# Patient Record
Sex: Female | Born: 1970 | Race: Black or African American | Hispanic: No | Smoking: Never smoker
Health system: Southern US, Community
[De-identification: ages and names within clinical notes are randomized; demographics above are authoritative.]

## PROBLEM LIST (undated history)

## (undated) DIAGNOSIS — N939 Abnormal uterine and vaginal bleeding, unspecified: Secondary | ICD-10-CM

## (undated) DIAGNOSIS — N649 Disorder of breast, unspecified: Secondary | ICD-10-CM

## (undated) DIAGNOSIS — D259 Leiomyoma of uterus, unspecified: Secondary | ICD-10-CM

## (undated) DIAGNOSIS — I10 Essential (primary) hypertension: Secondary | ICD-10-CM

## (undated) DIAGNOSIS — N9489 Other specified conditions associated with female genital organs and menstrual cycle: Secondary | ICD-10-CM

## (undated) DIAGNOSIS — Z8759 Personal history of other complications of pregnancy, childbirth and the puerperium: Secondary | ICD-10-CM

## (undated) DIAGNOSIS — N920 Excessive and frequent menstruation with regular cycle: Secondary | ICD-10-CM

## (undated) DIAGNOSIS — R112 Nausea with vomiting, unspecified: Secondary | ICD-10-CM

## (undated) DIAGNOSIS — D649 Anemia, unspecified: Secondary | ICD-10-CM

## (undated) DIAGNOSIS — B009 Herpesviral infection, unspecified: Secondary | ICD-10-CM

## (undated) DIAGNOSIS — Z9889 Other specified postprocedural states: Secondary | ICD-10-CM

## (undated) HISTORY — DX: Disorder of breast, unspecified: N64.9

## (undated) HISTORY — PX: LASIK: SHX215

## (undated) HISTORY — DX: Essential (primary) hypertension: I10

## (undated) HISTORY — DX: Other specified conditions associated with female genital organs and menstrual cycle: N94.89

## (undated) HISTORY — DX: Herpesviral infection, unspecified: B00.9

## (undated) HISTORY — PX: BREAST SURGERY: SHX581

---

## 1997-12-02 DIAGNOSIS — R112 Nausea with vomiting, unspecified: Secondary | ICD-10-CM

## 1997-12-02 DIAGNOSIS — N649 Disorder of breast, unspecified: Secondary | ICD-10-CM

## 1997-12-02 HISTORY — DX: Nausea with vomiting, unspecified: R11.2

## 1997-12-02 HISTORY — DX: Disorder of breast, unspecified: N64.9

## 1997-12-02 HISTORY — PX: BACK SURGERY: SHX140

## 1998-07-08 ENCOUNTER — Emergency Department (HOSPITAL_COMMUNITY): Admission: EM | Admit: 1998-07-08 | Discharge: 1998-07-08 | Payer: Self-pay | Admitting: Emergency Medicine

## 1998-09-26 ENCOUNTER — Ambulatory Visit (HOSPITAL_COMMUNITY): Admission: RE | Admit: 1998-09-26 | Discharge: 1998-09-27 | Payer: Self-pay | Admitting: Orthopaedic Surgery

## 1998-11-11 ENCOUNTER — Encounter: Payer: Self-pay | Admitting: *Deleted

## 1998-11-11 ENCOUNTER — Inpatient Hospital Stay (HOSPITAL_COMMUNITY): Admission: AD | Admit: 1998-11-11 | Discharge: 1998-11-11 | Payer: Self-pay | Admitting: *Deleted

## 1998-11-12 ENCOUNTER — Inpatient Hospital Stay (HOSPITAL_COMMUNITY): Admission: AD | Admit: 1998-11-12 | Discharge: 1998-11-12 | Payer: Self-pay | Admitting: *Deleted

## 1998-11-15 ENCOUNTER — Ambulatory Visit (HOSPITAL_COMMUNITY): Admission: RE | Admit: 1998-11-15 | Discharge: 1998-11-15 | Payer: Self-pay | Admitting: *Deleted

## 1998-11-19 ENCOUNTER — Inpatient Hospital Stay (HOSPITAL_COMMUNITY): Admission: AD | Admit: 1998-11-19 | Discharge: 1998-11-19 | Payer: Self-pay | Admitting: Obstetrics & Gynecology

## 1998-11-22 ENCOUNTER — Ambulatory Visit (HOSPITAL_COMMUNITY): Admission: RE | Admit: 1998-11-22 | Discharge: 1998-11-22 | Payer: Self-pay | Admitting: Obstetrics and Gynecology

## 1998-11-25 ENCOUNTER — Inpatient Hospital Stay (HOSPITAL_COMMUNITY): Admission: AD | Admit: 1998-11-25 | Discharge: 1998-11-25 | Payer: Self-pay | Admitting: Obstetrics and Gynecology

## 1998-11-28 ENCOUNTER — Inpatient Hospital Stay (HOSPITAL_COMMUNITY): Admission: AD | Admit: 1998-11-28 | Discharge: 1998-11-28 | Payer: Self-pay | Admitting: *Deleted

## 1998-12-15 ENCOUNTER — Encounter: Payer: Self-pay | Admitting: Emergency Medicine

## 1998-12-15 ENCOUNTER — Emergency Department (HOSPITAL_COMMUNITY): Admission: EM | Admit: 1998-12-15 | Discharge: 1998-12-15 | Payer: Self-pay | Admitting: Emergency Medicine

## 2000-12-11 ENCOUNTER — Encounter: Payer: Self-pay | Admitting: Emergency Medicine

## 2000-12-11 ENCOUNTER — Emergency Department (HOSPITAL_COMMUNITY): Admission: EM | Admit: 2000-12-11 | Discharge: 2000-12-11 | Payer: Self-pay | Admitting: Emergency Medicine

## 2002-01-29 ENCOUNTER — Other Ambulatory Visit: Admission: RE | Admit: 2002-01-29 | Discharge: 2002-01-29 | Payer: Self-pay | Admitting: Family Medicine

## 2002-06-17 ENCOUNTER — Emergency Department (HOSPITAL_COMMUNITY): Admission: EM | Admit: 2002-06-17 | Discharge: 2002-06-17 | Payer: Self-pay | Admitting: Emergency Medicine

## 2003-01-31 ENCOUNTER — Other Ambulatory Visit: Admission: RE | Admit: 2003-01-31 | Discharge: 2003-01-31 | Payer: Self-pay | Admitting: Family Medicine

## 2004-09-13 ENCOUNTER — Other Ambulatory Visit: Admission: RE | Admit: 2004-09-13 | Discharge: 2004-09-13 | Payer: Self-pay | Admitting: Obstetrics and Gynecology

## 2006-02-19 ENCOUNTER — Other Ambulatory Visit: Admission: RE | Admit: 2006-02-19 | Discharge: 2006-02-19 | Payer: Self-pay | Admitting: Obstetrics and Gynecology

## 2007-03-10 ENCOUNTER — Ambulatory Visit (HOSPITAL_COMMUNITY): Admission: RE | Admit: 2007-03-10 | Discharge: 2007-03-10 | Payer: Self-pay | Admitting: Obstetrics and Gynecology

## 2009-03-24 ENCOUNTER — Encounter: Admission: RE | Admit: 2009-03-24 | Discharge: 2009-03-24 | Payer: Self-pay | Admitting: Obstetrics and Gynecology

## 2010-03-20 ENCOUNTER — Encounter: Admission: RE | Admit: 2010-03-20 | Discharge: 2010-03-20 | Payer: Self-pay | Admitting: Obstetrics and Gynecology

## 2011-02-26 ENCOUNTER — Other Ambulatory Visit: Payer: Self-pay | Admitting: Obstetrics and Gynecology

## 2011-02-26 DIAGNOSIS — Z1231 Encounter for screening mammogram for malignant neoplasm of breast: Secondary | ICD-10-CM

## 2011-04-16 ENCOUNTER — Ambulatory Visit
Admission: RE | Admit: 2011-04-16 | Discharge: 2011-04-16 | Disposition: A | Discharge status: Home / Self Care | Payer: Federal, State, Local not specified - PPO | Source: Ambulatory Visit | Attending: Obstetrics and Gynecology | Admitting: Obstetrics and Gynecology

## 2011-04-16 DIAGNOSIS — Z1231 Encounter for screening mammogram for malignant neoplasm of breast: Secondary | ICD-10-CM

## 2012-04-20 ENCOUNTER — Other Ambulatory Visit: Payer: Self-pay | Admitting: Obstetrics and Gynecology

## 2012-04-20 DIAGNOSIS — Z1231 Encounter for screening mammogram for malignant neoplasm of breast: Secondary | ICD-10-CM

## 2012-04-29 ENCOUNTER — Ambulatory Visit
Admission: RE | Admit: 2012-04-29 | Discharge: 2012-04-29 | Disposition: A | Discharge status: Home / Self Care | Payer: Federal, State, Local not specified - PPO | Source: Ambulatory Visit | Attending: Obstetrics and Gynecology | Admitting: Obstetrics and Gynecology

## 2012-04-29 ENCOUNTER — Ambulatory Visit (INDEPENDENT_AMBULATORY_CARE_PROVIDER_SITE_OTHER): Payer: Federal, State, Local not specified - PPO | Admitting: Obstetrics and Gynecology

## 2012-04-29 ENCOUNTER — Encounter: Payer: Self-pay | Admitting: Obstetrics and Gynecology

## 2012-04-29 VITALS — BP 112/64 | Ht 67.0 in | Wt 164.0 lb

## 2012-04-29 DIAGNOSIS — N920 Excessive and frequent menstruation with regular cycle: Secondary | ICD-10-CM

## 2012-04-29 DIAGNOSIS — Z1231 Encounter for screening mammogram for malignant neoplasm of breast: Secondary | ICD-10-CM

## 2012-04-29 DIAGNOSIS — Z01419 Encounter for gynecological examination (general) (routine) without abnormal findings: Secondary | ICD-10-CM

## 2012-04-29 MED ORDER — ETONOGESTREL-ETHINYL ESTRADIOL 0.12-0.015 MG/24HR VA RING: VAGINAL_RING | VAGINAL | Status: DC

## 2012-04-29 NOTE — Progress Notes (Signed)
The patient reports:no complaints  Contraception:NuvaRing vaginal inserts  Last mammogram: had mammo today  Last pap: approximate date 03/26/2010 and was normal  GC/Chlamydia cultures offered: declined HIV/RPR/HbsAg offered:  declined HSV 1 and 2 glycoprotein offered: declined  Menstrual cycle regular and monthly: Yes Menstrual flow normal: Yes  Urinary symptoms: none Normal bowel movements: Yes Reports abuse at home: No:   Pt states that she injured her back in 08/2011 and is on muscle relaxer but is not sure of the name . Pt wanting to do a pap this visit.   Subjective:    Katie Kemp is a 41 y.o. female, [id number], who presents for an annual exam.     History   Social History  . Marital Status: Legally Separated    Spouse Name: N/A    Number of Children: N/A  . Years of Education: N/A   Social History Main Topics  . Smoking status: Never Smoker   . Smokeless tobacco: Never Used  . Alcohol Use: No  . Drug Use: No  . Sexually Active: Yes    Birth Control/ Protection: Inserts   Other Topics Concern  . None   Social History Narrative  . None    Menstrual cycle:   LMP: Patient's last menstrual period was 04/01/2012.           Cycle: Every 3 months as withdrawal from extended cycle Nuvaring  The following portions of the patient's history were reviewed and updated as appropriate: allergies, current medications, past family history, past medical history, past social history, past surgical history and problem list.  Review of Systems Pertinent items are noted in HPI. Breast:Negative for breast lump,nipple discharge or nipple retraction Gastrointestinal: Negative for abdominal pain, change in bowel habits or rectal bleeding Urinary:negative   Objective:    BP 112/64  Ht 5\' 7"  (1.702 m)  Wt 164 lb (74.39 kg)  BMI 25.69 kg/m2  LMP 04/01/2012    Weight:  Wt Readings from Last 1 Encounters:  04/29/12 164 lb (74.39 kg)          BMI: Body mass index is 25.69  kg/(m^2).  General Appearance: Alert, appropriate appearance for age. No acute distress HEENT: Grossly normal Neck / Thyroid: Supple, no masses, nodes or enlargement Lungs: clear to auscultation bilaterally Back: No CVA tenderness Breast Exam: No masses or nodes.No dimpling, nipple retraction or discharge. Cardiovascular: Regular rate and rhythm. S1, S2, no murmur Gastrointestinal: Soft, non-tender, no masses or organomegaly Pelvic Exam: External genitalia: normal general appearance Vaginal: normal mucosa without prolapse or lesions Cervix: normal appearance Adnexa: not enlarged Uterus: 8 wks size mobile and nontender Rectovaginal: normal rectal, no masses Lymphatic Exam: Non-palpable nodes in neck, clavicular, axillary, or inguinal regions Skin: no rash or abnormalities Neurologic: Normal gait and speech, no tremor  Psychiatric: Alert and oriented, appropriate affect.      Assessment:    Asymptomatic fibroids  Menorrhagia controlled with extended cycle Nvaring   Plan:    mammogram pap smear due 2014 with HR HPV return annually or prn STD screening: declined Contraception:NuvaRing vaginal inserts      Amogh Komatsu PMD

## 2012-05-04 LAB — PAP IG AND HPV HIGH-RISK

## 2012-05-06 ENCOUNTER — Other Ambulatory Visit: Payer: Self-pay | Admitting: Obstetrics and Gynecology

## 2012-05-06 DIAGNOSIS — R928 Other abnormal and inconclusive findings on diagnostic imaging of breast: Secondary | ICD-10-CM

## 2012-05-13 ENCOUNTER — Ambulatory Visit
Admission: RE | Admit: 2012-05-13 | Discharge: 2012-05-13 | Disposition: A | Discharge status: Home / Self Care | Payer: Federal, State, Local not specified - PPO | Source: Ambulatory Visit | Attending: Obstetrics and Gynecology | Admitting: Obstetrics and Gynecology

## 2012-05-13 DIAGNOSIS — R928 Other abnormal and inconclusive findings on diagnostic imaging of breast: Secondary | ICD-10-CM

## 2012-06-22 ENCOUNTER — Telehealth: Payer: Self-pay

## 2012-06-22 ENCOUNTER — Telehealth: Payer: Self-pay | Admitting: Obstetrics and Gynecology

## 2012-06-22 NOTE — Telephone Encounter (Signed)
Chandra/vph/rx

## 2012-06-22 NOTE — Telephone Encounter (Signed)
vph pt 

## 2012-06-22 NOTE — Telephone Encounter (Signed)
Tc from pt rgdg Nuvaring rx. Pt wants Nuvaring in a 90d supply. Pc to [facility]([address]) and rx for Nuvaring changed to 90d supply. Spoke with Mia(pharm). Pt aware.

## 2012-06-24 ENCOUNTER — Encounter: Payer: Self-pay | Admitting: Obstetrics and Gynecology

## 2012-09-08 ENCOUNTER — Telehealth: Payer: Self-pay

## 2012-09-08 NOTE — Telephone Encounter (Signed)
VM from pt. Pt states pharmacy is giving her a hard time with her Nuva Rings. She stated they are giving her one Nuva Ring at a time instead of three. She requests a call to the pharmacy to fix this issue & if they can't fix it, she wants rx to be sent to a different pharmacy.

## 2012-09-08 NOTE — Telephone Encounter (Signed)
Pc to pharm on file. Rx for Nuvaring corrected to disp#3 per Nuvaring directions. Spoke with Mia(pharm). Pt aware.

## 2013-06-02 ENCOUNTER — Other Ambulatory Visit: Payer: Self-pay

## 2013-06-02 DIAGNOSIS — Z1231 Encounter for screening mammogram for malignant neoplasm of breast: Secondary | ICD-10-CM

## 2013-06-07 ENCOUNTER — Ambulatory Visit
Admission: RE | Admit: 2013-06-07 | Discharge: 2013-06-07 | Disposition: A | Discharge status: Home / Self Care | Payer: Federal, State, Local not specified - PPO | Source: Ambulatory Visit

## 2013-06-07 DIAGNOSIS — Z1231 Encounter for screening mammogram for malignant neoplasm of breast: Secondary | ICD-10-CM

## 2014-04-12 ENCOUNTER — Other Ambulatory Visit (HOSPITAL_COMMUNITY): Payer: Self-pay | Admitting: Cardiology

## 2014-04-12 DIAGNOSIS — R079 Chest pain, unspecified: Secondary | ICD-10-CM

## 2014-04-25 ENCOUNTER — Other Ambulatory Visit (HOSPITAL_COMMUNITY): Payer: Federal, State, Local not specified - PPO

## 2014-04-26 ENCOUNTER — Other Ambulatory Visit (HOSPITAL_COMMUNITY): Payer: Federal, State, Local not specified - PPO

## 2014-05-20 ENCOUNTER — Institutional Professional Consult (permissible substitution): Payer: Federal, State, Local not specified - PPO | Admitting: Internal Medicine

## 2014-05-23 ENCOUNTER — Encounter: Payer: Self-pay | Admitting: Internal Medicine

## 2014-05-23 ENCOUNTER — Ambulatory Visit (INDEPENDENT_AMBULATORY_CARE_PROVIDER_SITE_OTHER): Payer: Federal, State, Local not specified - PPO | Admitting: Internal Medicine

## 2014-05-23 ENCOUNTER — Other Ambulatory Visit: Payer: Federal, State, Local not specified - PPO

## 2014-05-23 VITALS — BP 126/78 | HR 78 | Ht 67.0 in | Wt 174.8 lb

## 2014-05-23 DIAGNOSIS — J3089 Other allergic rhinitis: Secondary | ICD-10-CM

## 2014-05-23 DIAGNOSIS — J309 Allergic rhinitis, unspecified: Secondary | ICD-10-CM

## 2014-05-23 DIAGNOSIS — J302 Other seasonal allergic rhinitis: Secondary | ICD-10-CM

## 2014-05-23 NOTE — Patient Instructions (Signed)
Ok to try the Neti pot, once a day or so, if needed   Order- lab- Allergy profile       Dx allergic rhinitis  Consider trying otc nasal spray Nasalcrom/ cromol/ cromolyn

## 2014-05-23 NOTE — Progress Notes (Signed)
05/23/14- 43 yo F never smoker COMPLAINS OF:  Referred by Dr. Wallene Huh.  Reports sinus pressure, cough, PND.  States if she does not Allegra face swells and sinuses clog up, gets presure headaches. In last month sneezing, blowing clear.  Problem x 3 years. Comfortable if she stays on allegra. No prior hx seasonal rhinitis, asthma, haves or other allergy.  No ENT surgery. No obvious effect of exposure, location. Environment- house, carpet, no basement, no smokers, no pet. 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 No mold noted. Divorced, living alone.  Prior to Admission medications   Medication Sig Start Date End Date Taking? Authorizing Zaedyn Covin  AZOR 5-20 MG per tablet 1 tablet daily. 05/18/14  Yes Historical Exie Chrismer, MD  etonogestrel-ethinyl estradiol (NUVARING) 0.12-0.015 MG/24HR vaginal ring See instructions above 04/29/12  Yes Eldred Manges, MD  fexofenadine (ALLEGRA ALLERGY) 180 MG tablet Take 180 mg by mouth daily.   Yes Historical Esley Brooking, MD  HYDROcodone-acetaminophen (NORCO/VICODIN) 5-325 MG per tablet Take 1 tablet by mouth as needed for moderate pain.   Yes Historical Lenwood Balsam, MD  Multiple Vitamin (MULTIVITAMIN) tablet Take 1 tablet by mouth daily.   Yes Historical Janella Rogala, MD  naproxen (NAPROSYN) 500 MG tablet Take 500 mg by mouth as needed.   Yes Historical Meribeth Vitug, MD   Past Medical History  Diagnosis Date  . HSV-2 (herpes simplex virus 2) infection   . Threatened abortion   . H/O menorrhagia   . Breast lesion     history of transient breast lesion  . Ectopic pregnancy 2000  . Adnexal mass     right  . Stroke   . Hypertension   . Diabetes mellitus   . Fibroid    Past Surgical History  Procedure Laterality Date  . Back surgery     Family History  Problem Relation Age of Onset  . Stroke Father   . Hypertension Father   . Cancer Father     prostate  . Anemia Sister    History   Social History  . Marital Status: Divorced    Spouse Name: N/A    Number of Children:  N/A  . Years of Education: N/A   Occupational History  . Not on file.   Social History Main Topics  . Smoking status: Never Smoker   . Smokeless tobacco: Never Used  . Alcohol Use: No  . Drug Use: No  . Sexual Activity: Yes    Birth Control/ Protection: Inserts   Other Topics Concern  . Not on file   Social History Narrative  . No narrative on file   ROS-see HPI Constitutional:   No-   weight loss, night sweats, fevers, chills, fatigue, lassitude. HEENT:  + headaches, difficulty swallowing, tooth/dental problems, sore throat,       +sneezing, itching, ear ache, +nasal congestion, +post nasal drip,  CV:  No-   chest pain, orthopnea, PND, swelling in lower extremities, anasarca,                                  dizziness, palpitations Resp: No-   shortness of breath with exertion or at rest.              No-   productive cough,  No non-productive cough,  No- coughing up of blood.              No-   change in color of mucus.  No- wheezing.  Skin: No-   rash or lesions. GI:  No-   heartburn, indigestion, abdominal pain, nausea, vomiting, diarrhea,                 change in bowel habits, loss of appetite GU: No-   dysuria, change in color of urine, no urgency or frequency.  No- flank pain. MS:  No-   joint pain or swelling.  No- decreased range of motion.  No- back pain. Neuro-     nothing unusual Psych:  No- change in mood or affect. No depression or anxiety.  No memory loss.  OBJ- Physical Exam General- Alert, Oriented, Affect-appropriate, Distress- none acute Skin- rash-none, lesions- none, excoriation- none Lymphadenopathy- none Head- atraumatic            Eyes- Gross vision intact, PERRLA, conjunctivae and secretions clear            Ears- Hearing, canals-normal            Nose- +prominent turbinates, no-Septal dev, mucus, polyps, erosion, perforation             Throat- Mallampati II , mucosa clear , drainage- none, tonsils- atrophic Neck- flexible , trachea midline, no  stridor , thyroid nl, carotid no bruit Chest - symmetrical excursion , unlabored           Heart/CV- RRR , no murmur , no gallop  , no rub, nl s1 s2                           - JVD- none , edema- none, stasis changes- none, varices- none           Lung- clear to P&A, wheeze- none, cough- none , dullness-none, rub- none           Chest wall-  Abd- tender-no, distended-no, bowel sounds-present, HSM- no Br/ Gen/ Rectal- Not done, not indicated Extrem- cyanosis- none, clubbing, none, atrophy- none, strength- nl Neuro- grossly intact to observation

## 2014-05-24 ENCOUNTER — Ambulatory Visit (HOSPITAL_COMMUNITY)
Admission: RE | Admit: 2014-05-24 | Discharge: 2014-05-24 | Disposition: A | Discharge status: Home / Self Care | Payer: Federal, State, Local not specified - PPO | Source: Ambulatory Visit | Attending: Cardiology | Admitting: Cardiology

## 2014-05-24 DIAGNOSIS — I359 Nonrheumatic aortic valve disorder, unspecified: Secondary | ICD-10-CM

## 2014-05-24 DIAGNOSIS — R079 Chest pain, unspecified: Secondary | ICD-10-CM | POA: Insufficient documentation

## 2014-05-24 HISTORY — PX: TRANSTHORACIC ECHOCARDIOGRAM: SHX275

## 2014-05-24 LAB — ALLERGY FULL PROFILE
ALLERGEN, D PTERNOYSSINUS, D1: 0.17 kU/L — AB
Alternaria Alternata: 0.11 kU/L — ABNORMAL HIGH
Aspergillus fumigatus, m3: <0.1 kU/L
Bahia Grass: <0.1 kU/L
Bermuda Grass: <0.1 kU/L
Candida Albicans: <0.1 kU/L
Cat Dander: <0.1 kU/L
Common Ragweed: <0.1 kU/L
D. farinae: 0.24 kU/L — ABNORMAL HIGH
Dog Dander: <0.1 kU/L
Elm IgE: <0.1 kU/L
G009 Red Top: <0.1 kU/L
Goldenrod: <0.1 kU/L
Helminthosporium halodes: <0.1 kU/L
IgE (Immunoglobulin E), Serum: 100.5 IU/mL (ref 0.0–180.0)
Lamb's Quarters: <0.1 kU/L
Plantain: <0.1 kU/L
Stemphylium Botryosum: <0.1 kU/L
Sycamore Tree: <0.1 kU/L

## 2014-05-24 NOTE — Progress Notes (Signed)
Echo Lab  2D Echocardiogram completed.  [place], RDCS 05/24/2014 3:49 PM

## 2014-07-15 ENCOUNTER — Ambulatory Visit (INDEPENDENT_AMBULATORY_CARE_PROVIDER_SITE_OTHER): Payer: Federal, State, Local not specified - PPO | Admitting: Internal Medicine

## 2014-07-15 ENCOUNTER — Encounter: Payer: Self-pay | Admitting: Internal Medicine

## 2014-07-15 VITALS — BP 108/60 | HR 68 | Ht 67.0 in | Wt 175.2 lb

## 2014-07-15 DIAGNOSIS — J302 Other seasonal allergic rhinitis: Secondary | ICD-10-CM | POA: Insufficient documentation

## 2014-07-15 DIAGNOSIS — J309 Allergic rhinitis, unspecified: Secondary | ICD-10-CM

## 2014-07-15 DIAGNOSIS — J3089 Other allergic rhinitis: Principal | ICD-10-CM

## 2014-07-15 NOTE — Progress Notes (Signed)
05/23/14- 43 yo F never smoker COMPLAINS OF: Referred by Dr. Wallene Huh. Reports sinus pressure, cough, PND. States if she does not Allegra face swells and sinuses clog up, gets presure headaches. In last month sneezing, blowing clear.  Problem x 3 years. Comfortable if she stays on allegra. No prior hx seasonal rhinitis, asthma, hives or other allergy.  No ENT surgery. No obvious effect of exposure, location.  Environment- house, carpet, no basement, no smokers, no pet. 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 No mold noted. Divorced, living alone.  07/15/14- 43 yoF never smoker followed for Allergic rhinitis FOLLOWS FOR: review allergy labs in detail with patient; patient states she is doing well with her allergies at this time. We had suggested trial Nasalcrom, and continue allegra as needed Allergy Profile 05/23/14- Total IgE 100.5, specific elevations for dust mites and alternaria mold.  ROS-see HPI Constitutional:   No-   weight loss, night sweats, fevers, chills, fatigue, lassitude. HEENT:   No-  headaches, difficulty swallowing, tooth/dental problems, sore throat,       No-  sneezing, itching, ear ache, +nasal congestion, post nasal drip,  CV:  No-   chest pain, orthopnea, PND, swelling in lower extremities, anasarca,                                  dizziness, palpitations Resp: No-   shortness of breath with exertion or at rest.              No-   productive cough,  No non-productive cough,  No- coughing up of blood.              No-   change in color of mucus.  No- wheezing.   Skin: No-   rash or lesions. GI:  No-   heartburn, indigestion, abdominal pain, nausea, vomiting,  GU:  MS:  No-   joint pain or swelling.   Neuro-     nothing unusual Psych:  No- change in mood or affect. No depression or anxiety.  No memory loss.  OBJ- Physical Exam General- Alert, Oriented, Affect-appropriate, Distress- none acute Skin- rash-none, lesions- none, excoriation- none Lymphadenopathy- none Head-  atraumatic            Eyes- Gross vision intact, PERRLA, conjunctivae and secretions clear            Ears- Hearing, canals-normal            Nose- Clear, no-Septal dev, mucus, polyps, erosion, perforation             Throat- Mallampati II , mucosa clear , drainage- none, tonsils- atrophic Neck- flexible , trachea midline, no stridor , thyroid nl, carotid no bruit Chest - symmetrical excursion , unlabored           Heart/CV- RRR , no murmur , no gallop  , no rub, nl s1 s2                           - JVD- none , edema- none, stasis changes- none, varices- none           Lung- clear to P&A, wheeze- none, cough- none , dullness-none, rub- none           Chest wall-  Abd-  Br/ Gen/ Rectal- Not done, not indicated Extrem- cyanosis- none, clubbing, none, atrophy- none, strength- nl Neuro- grossly intact to observation

## 2014-07-15 NOTE — Assessment & Plan Note (Signed)
No identified mold exposure. House dust and common inhalants addressed with review of environmental precautions, encasings, air cleaners, and appropriate use of antihistamine and nasal spray. She may have more problems in other seasons, but for now is satisfied and will return prn.

## 2014-07-15 NOTE — Patient Instructions (Signed)
Ok to use the allegra antihistamine, the Nasalcrom/ cromol nasal spray, and consider the environmental dust and pollen control measures we reviewed.  Happy to see you back as needed

## 2014-07-15 NOTE — Assessment & Plan Note (Signed)
Not clear what has changed that she should start having symptoms, unless related to possibly peri-menopausal. Discussed allergy vs non-allergic, available meds Plan- try otc Nasalcrom/ cromol, lab for Allergy Profile, watch effect of season and exposure more closely.

## 2014-10-03 ENCOUNTER — Encounter: Payer: Self-pay | Admitting: Internal Medicine

## 2015-01-03 ENCOUNTER — Other Ambulatory Visit: Payer: Self-pay

## 2015-01-03 DIAGNOSIS — Z1231 Encounter for screening mammogram for malignant neoplasm of breast: Secondary | ICD-10-CM

## 2015-01-06 ENCOUNTER — Encounter (INDEPENDENT_AMBULATORY_CARE_PROVIDER_SITE_OTHER): Payer: Self-pay

## 2015-01-06 ENCOUNTER — Ambulatory Visit
Admission: RE | Admit: 2015-01-06 | Discharge: 2015-01-06 | Disposition: A | Discharge status: Home / Self Care | Payer: Federal, State, Local not specified - PPO | Source: Ambulatory Visit

## 2015-01-06 DIAGNOSIS — Z1231 Encounter for screening mammogram for malignant neoplasm of breast: Secondary | ICD-10-CM

## 2016-04-05 ENCOUNTER — Ambulatory Visit
Admission: RE | Admit: 2016-04-05 | Discharge: 2016-04-05 | Disposition: A | Discharge status: Home / Self Care | Payer: Federal, State, Local not specified - PPO | Source: Ambulatory Visit | Attending: Family Medicine | Admitting: Family Medicine

## 2016-04-05 ENCOUNTER — Other Ambulatory Visit: Payer: Self-pay

## 2016-04-05 ENCOUNTER — Ambulatory Visit
Admission: RE | Admit: 2016-04-05 | Discharge: 2016-04-05 | Disposition: A | Discharge status: Home / Self Care | Payer: Federal, State, Local not specified - PPO | Source: Ambulatory Visit

## 2016-04-05 ENCOUNTER — Other Ambulatory Visit: Payer: Self-pay | Admitting: Family Medicine

## 2016-04-05 DIAGNOSIS — M79675 Pain in left toe(s): Secondary | ICD-10-CM | POA: Diagnosis not present

## 2016-04-05 DIAGNOSIS — Z1231 Encounter for screening mammogram for malignant neoplasm of breast: Secondary | ICD-10-CM | POA: Diagnosis not present

## 2016-04-05 DIAGNOSIS — D649 Anemia, unspecified: Secondary | ICD-10-CM | POA: Diagnosis not present

## 2016-04-05 DIAGNOSIS — M7989 Other specified soft tissue disorders: Secondary | ICD-10-CM | POA: Diagnosis not present

## 2016-04-05 DIAGNOSIS — R609 Edema, unspecified: Secondary | ICD-10-CM

## 2016-04-05 DIAGNOSIS — F064 Anxiety disorder due to known physiological condition: Secondary | ICD-10-CM | POA: Diagnosis not present

## 2016-04-05 DIAGNOSIS — I10 Essential (primary) hypertension: Secondary | ICD-10-CM | POA: Diagnosis not present

## 2016-06-11 DIAGNOSIS — L509 Urticaria, unspecified: Secondary | ICD-10-CM | POA: Diagnosis not present

## 2016-06-11 DIAGNOSIS — J329 Chronic sinusitis, unspecified: Secondary | ICD-10-CM | POA: Diagnosis not present

## 2016-10-30 DIAGNOSIS — D259 Leiomyoma of uterus, unspecified: Secondary | ICD-10-CM | POA: Diagnosis not present

## 2016-10-30 DIAGNOSIS — N920 Excessive and frequent menstruation with regular cycle: Secondary | ICD-10-CM | POA: Diagnosis not present

## 2016-10-31 DIAGNOSIS — F064 Anxiety disorder due to known physiological condition: Secondary | ICD-10-CM | POA: Diagnosis not present

## 2016-10-31 DIAGNOSIS — I10 Essential (primary) hypertension: Secondary | ICD-10-CM | POA: Diagnosis not present

## 2016-10-31 DIAGNOSIS — Z6827 Body mass index (BMI) 27.0-27.9, adult: Secondary | ICD-10-CM | POA: Diagnosis not present

## 2016-10-31 DIAGNOSIS — D649 Anemia, unspecified: Secondary | ICD-10-CM | POA: Diagnosis not present

## 2016-11-07 DIAGNOSIS — N920 Excessive and frequent menstruation with regular cycle: Secondary | ICD-10-CM | POA: Diagnosis not present

## 2016-11-07 DIAGNOSIS — Z3040 Encounter for surveillance of contraceptives, unspecified: Secondary | ICD-10-CM | POA: Diagnosis not present

## 2016-11-07 DIAGNOSIS — D259 Leiomyoma of uterus, unspecified: Secondary | ICD-10-CM | POA: Diagnosis not present

## 2016-11-07 DIAGNOSIS — N858 Other specified noninflammatory disorders of uterus: Secondary | ICD-10-CM | POA: Diagnosis not present

## 2016-11-28 DIAGNOSIS — L293 Anogenital pruritus, unspecified: Secondary | ICD-10-CM | POA: Diagnosis not present

## 2016-11-28 DIAGNOSIS — N920 Excessive and frequent menstruation with regular cycle: Secondary | ICD-10-CM | POA: Diagnosis not present

## 2016-11-28 DIAGNOSIS — Z202 Contact with and (suspected) exposure to infections with a predominantly sexual mode of transmission: Secondary | ICD-10-CM | POA: Diagnosis not present

## 2016-11-28 DIAGNOSIS — D259 Leiomyoma of uterus, unspecified: Secondary | ICD-10-CM | POA: Diagnosis not present

## 2016-11-29 IMAGING — CR DG TOE 5TH 2+V*L*
3 series · 3 of 3 positions shown · non-contrast
Comparison: None in PACs

CLINICAL DATA: Two month history of pain and swelling over the
distal aspect of the fifth toe without known injury

EXAM:
DG TOE 5TH LEFT

[t toes ap left]
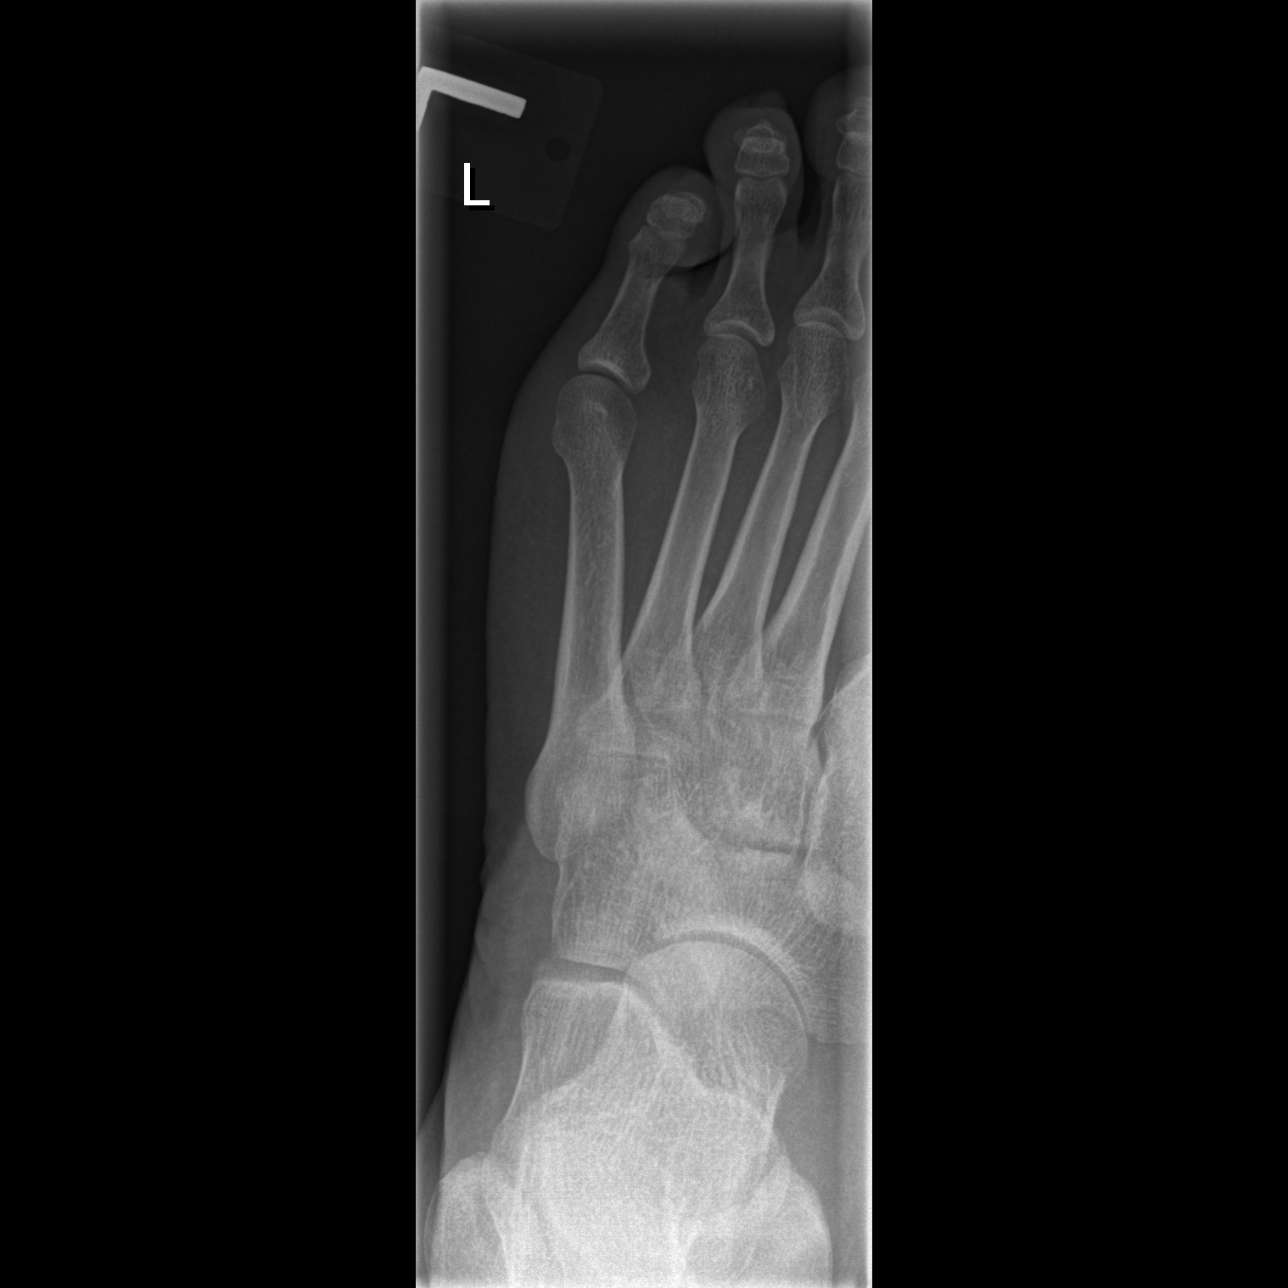

[t toes oblique left]
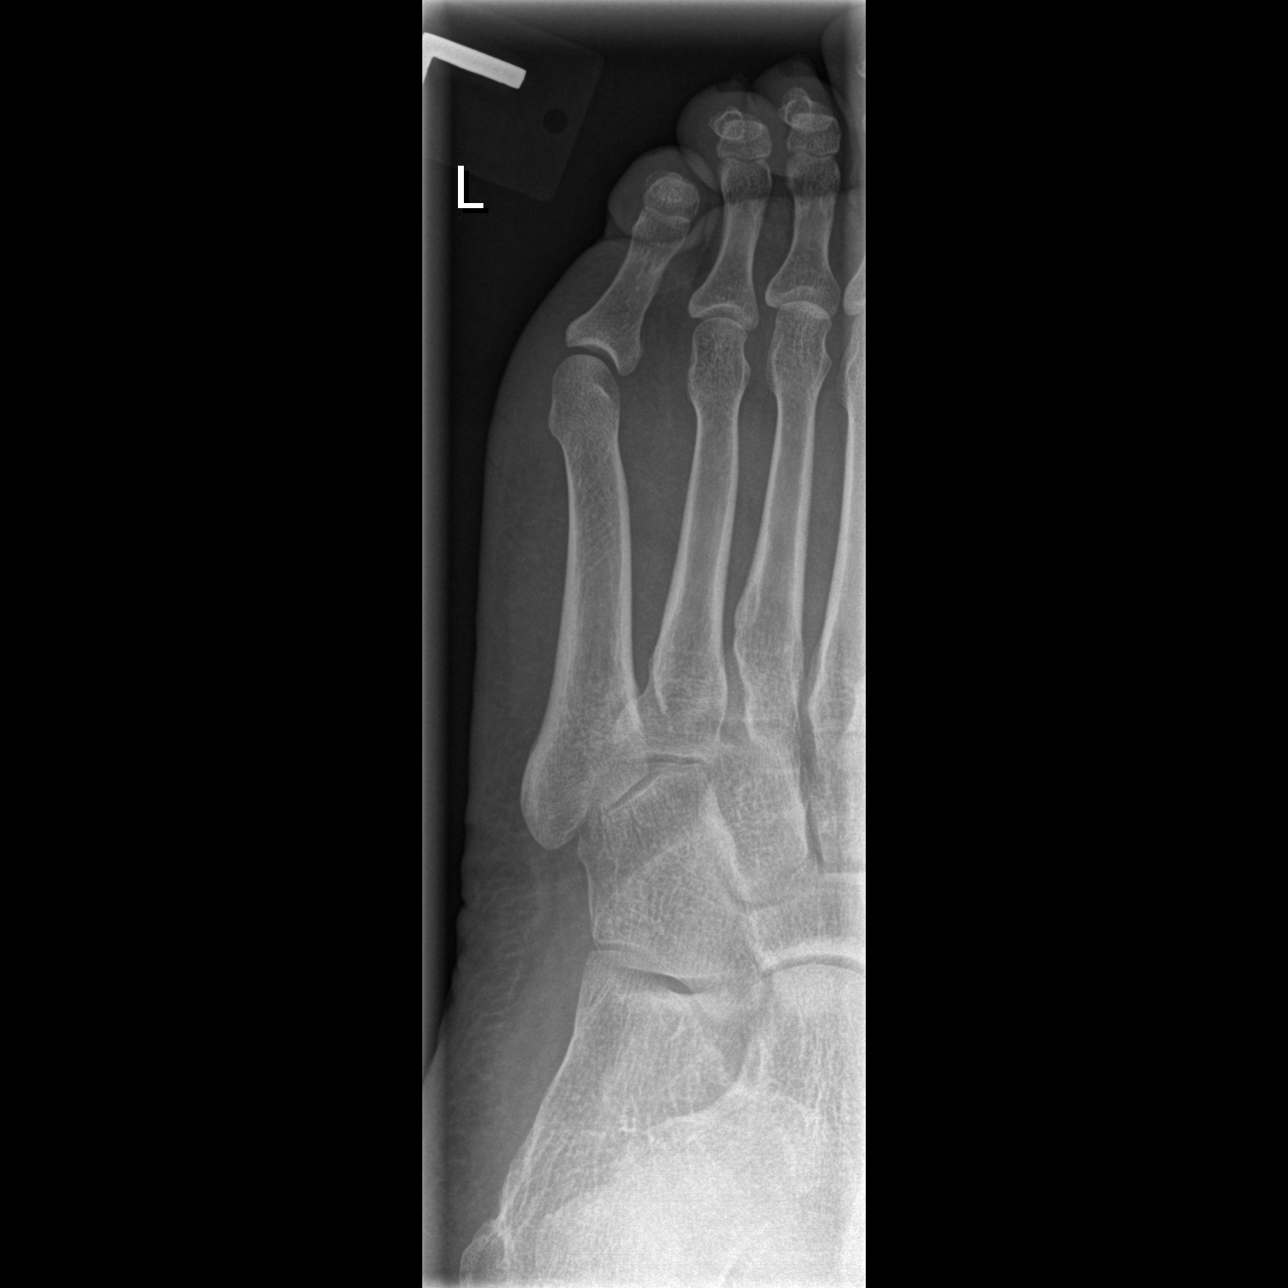

[t toes lateral left]
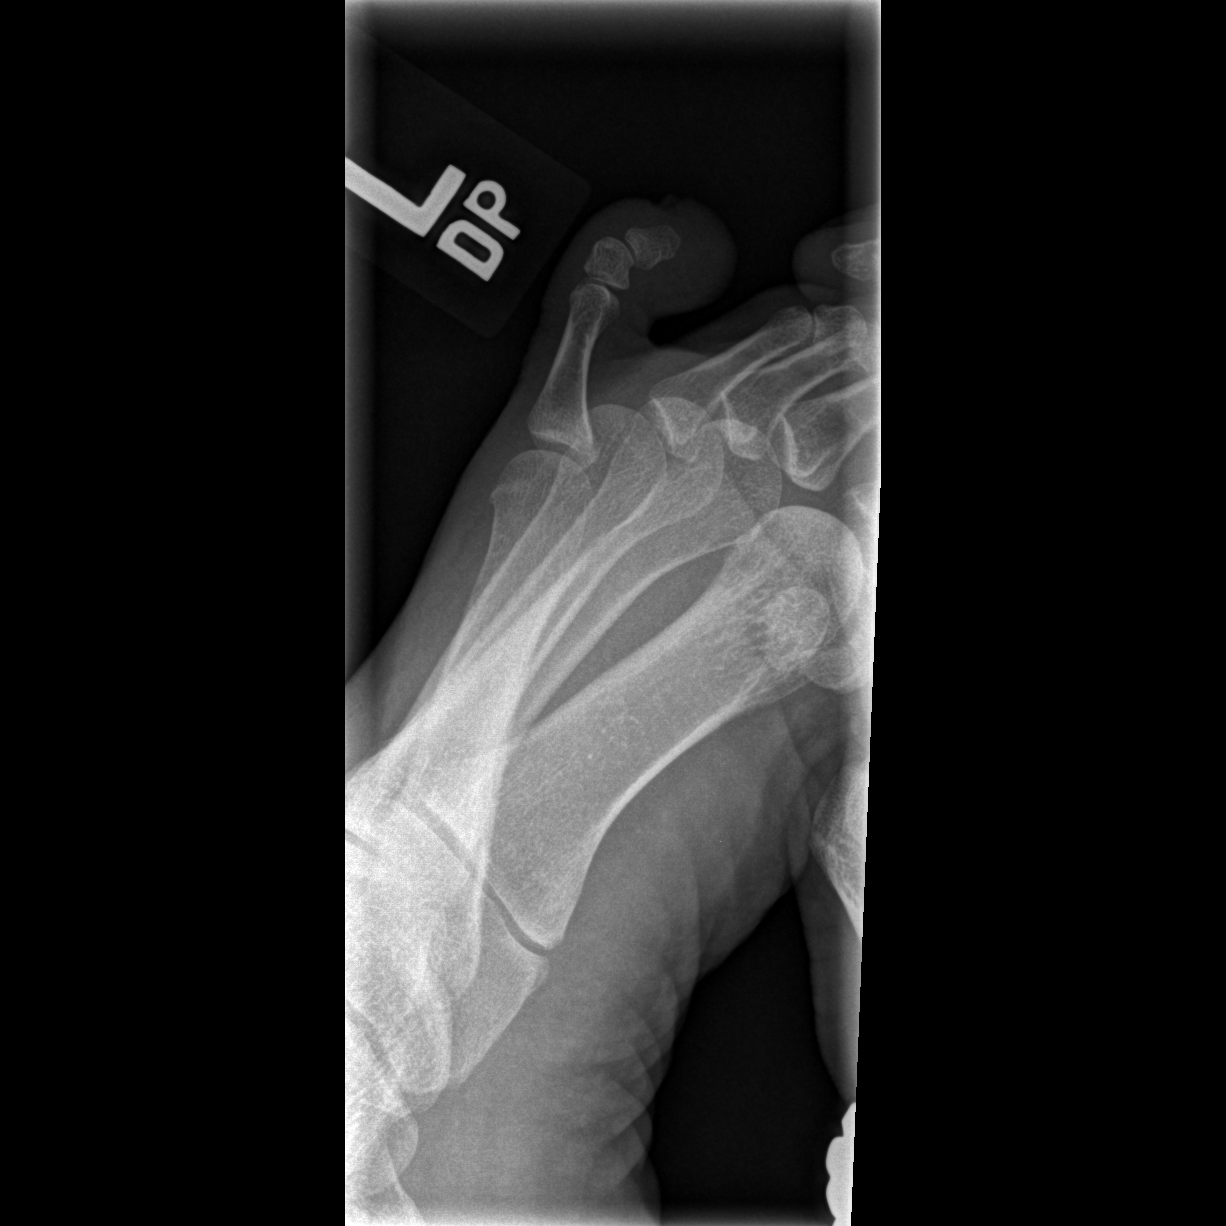

[3 of 3 positions shown; findings below may reference images not displayed]

FINDINGS: The bones of the fifth toe appear adequately mineralized. The
interphalangeal joint spaces are well maintained. The fifth
metatarsophalangeal joint also appears normal. There is no lytic or
blastic lesion or periosteal reaction. The soft tissues are
unremarkable.
IMPRESSION: There is no acute or significant chronic bony abnormality of the
left fifth toe. The soft tissues are unremarkable as well.

## 2017-01-27 DIAGNOSIS — D259 Leiomyoma of uterus, unspecified: Secondary | ICD-10-CM | POA: Diagnosis not present

## 2017-01-27 DIAGNOSIS — N939 Abnormal uterine and vaginal bleeding, unspecified: Secondary | ICD-10-CM | POA: Diagnosis not present

## 2017-01-27 DIAGNOSIS — Z124 Encounter for screening for malignant neoplasm of cervix: Secondary | ICD-10-CM | POA: Diagnosis not present

## 2017-01-27 DIAGNOSIS — Z113 Encounter for screening for infections with a predominantly sexual mode of transmission: Secondary | ICD-10-CM | POA: Diagnosis not present

## 2017-01-27 DIAGNOSIS — Z01411 Encounter for gynecological examination (general) (routine) with abnormal findings: Secondary | ICD-10-CM | POA: Diagnosis not present

## 2017-01-27 DIAGNOSIS — N920 Excessive and frequent menstruation with regular cycle: Secondary | ICD-10-CM | POA: Diagnosis not present

## 2017-01-27 DIAGNOSIS — N858 Other specified noninflammatory disorders of uterus: Secondary | ICD-10-CM | POA: Diagnosis not present

## 2017-02-04 ENCOUNTER — Encounter (HOSPITAL_COMMUNITY): Payer: Self-pay | Admitting: *Deleted

## 2017-02-04 ENCOUNTER — Other Ambulatory Visit: Payer: Self-pay | Admitting: Obstetrics and Gynecology

## 2017-02-07 NOTE — Patient Instructions (Addendum)
Your procedure is scheduled on:  Friday, February 14, 2017  Enter through the [employer] [facility] at:  10:30 AM  Pick up the phone at the desk and dial [phone].  Call this number if you have problems the morning of surgery: [phone].  Remember: Do NOT eat food:  After Midnight Thursday  Do NOT drink clear liquids after:  8:00 AM day of surgery  Take these medicines the morning of surgery with a SIP OF WATER:  None  Stop ALL herbal medications at this time  Do NOT smoke the day of surgery.  Do NOT wear jewelry (body piercing), metal hair clips/bobby pins, make-up, or nail polish. Do NOT wear lotions, powders, or perfumes.  You may wear deodorant. Do NOT shave for 48 hours prior to surgery. Do NOT bring valuables to the hospital. Contacts, dentures, or bridgework may not be worn into surgery.  Have a responsible adult drive you home and stay with you for 24 hours after your procedure  Bring a copy of your healthcare power of attorney and living will documents.  **Effective Friday, Jan. 12, 2018, [place] will implement no hospital visitations from children age 74 and younger due to a steady increase in flu activity in our community and hospitals. **

## 2017-02-10 ENCOUNTER — Other Ambulatory Visit: Payer: Self-pay

## 2017-02-10 ENCOUNTER — Encounter (HOSPITAL_COMMUNITY): Payer: Self-pay

## 2017-02-10 ENCOUNTER — Encounter (HOSPITAL_COMMUNITY)
Admission: RE | Admit: 2017-02-10 | Discharge: 2017-02-10 | Disposition: A | Discharge status: Home / Self Care | Payer: Federal, State, Local not specified - PPO | Source: Ambulatory Visit | Attending: Obstetrics and Gynecology | Admitting: Obstetrics and Gynecology

## 2017-02-10 DIAGNOSIS — Z01810 Encounter for preprocedural cardiovascular examination: Secondary | ICD-10-CM | POA: Diagnosis not present

## 2017-02-10 DIAGNOSIS — D259 Leiomyoma of uterus, unspecified: Secondary | ICD-10-CM | POA: Diagnosis not present

## 2017-02-10 DIAGNOSIS — Z01818 Encounter for other preprocedural examination: Secondary | ICD-10-CM | POA: Insufficient documentation

## 2017-02-10 HISTORY — DX: Nausea with vomiting, unspecified: R11.2

## 2017-02-10 HISTORY — DX: Other specified postprocedural states: Z98.890

## 2017-02-10 LAB — BASIC METABOLIC PANEL
Anion gap: 6 (ref 5–15)
BUN: 10 mg/dL (ref 6–20)
CO2: 23 mmol/L (ref 22–32)
CREATININE: 0.66 mg/dL (ref 0.44–1.00)
Calcium: 8.6 mg/dL — ABNORMAL LOW (ref 8.9–10.3)
Chloride: 107 mmol/L (ref 101–111)
Glucose, Bld: 102 mg/dL — ABNORMAL HIGH (ref 65–99)
Potassium: 3.7 mmol/L (ref 3.5–5.1)
SODIUM: 136 mmol/L (ref 135–145)

## 2017-02-10 LAB — CBC
HEMATOCRIT: 27.5 % — AB (ref 36.0–46.0)
Hemoglobin: 8.4 g/dL — ABNORMAL LOW (ref 12.0–15.0)
MCH: 23.8 pg — ABNORMAL LOW (ref 26.0–34.0)
MCHC: 30.5 g/dL (ref 30.0–36.0)
MCV: 77.9 fL — ABNORMAL LOW (ref 78.0–100.0)
PLATELETS: 381 10*3/uL (ref 150–400)
RBC: 3.53 MIL/uL — ABNORMAL LOW (ref 3.87–5.11)
RDW: 18.1 % — AB (ref 11.5–15.5)
WBC: 6 10*3/uL (ref 4.0–10.5)

## 2017-02-10 NOTE — Pre-Procedure Instructions (Signed)
Left message to make Katie Kemp aware at Dr. Caralee Ates office that hgb was 8.4 today.

## 2017-02-14 ENCOUNTER — Ambulatory Visit (HOSPITAL_COMMUNITY)
Admission: RE | Admit: 2017-02-14 | Payer: Federal, State, Local not specified - PPO | Source: Ambulatory Visit | Admitting: Obstetrics and Gynecology

## 2017-02-14 ENCOUNTER — Encounter (HOSPITAL_COMMUNITY): Admission: RE | Payer: Self-pay | Source: Ambulatory Visit

## 2017-02-14 ENCOUNTER — Encounter (HOSPITAL_BASED_OUTPATIENT_CLINIC_OR_DEPARTMENT_OTHER): Payer: Self-pay | Admitting: *Deleted

## 2017-02-14 SURGERY — SALPINGECTOMY, BILATERAL, LAPAROSCOPIC
Anesthesia: Choice | Laterality: Bilateral

## 2017-02-17 ENCOUNTER — Other Ambulatory Visit: Payer: Self-pay | Admitting: Obstetrics and Gynecology

## 2017-02-17 ENCOUNTER — Encounter (HOSPITAL_BASED_OUTPATIENT_CLINIC_OR_DEPARTMENT_OTHER): Payer: Self-pay | Admitting: *Deleted

## 2017-02-17 NOTE — Progress Notes (Signed)
To [facility] at 0700-CBC with chart,urine pregnancy on arrival. Instructed Npo after Mn.

## 2017-02-18 DIAGNOSIS — D219 Benign neoplasm of connective and other soft tissue, unspecified: Secondary | ICD-10-CM | POA: Diagnosis present

## 2017-02-18 DIAGNOSIS — D500 Iron deficiency anemia secondary to blood loss (chronic): Secondary | ICD-10-CM | POA: Diagnosis present

## 2017-02-18 DIAGNOSIS — N920 Excessive and frequent menstruation with regular cycle: Secondary | ICD-10-CM | POA: Diagnosis present

## 2017-02-18 DIAGNOSIS — N7011 Chronic salpingitis: Secondary | ICD-10-CM | POA: Diagnosis present

## 2017-02-18 NOTE — H&P (Signed)
Katie Kemp is an 46 y.o. female. 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    Past Medical History:  Diagnosis Date  . Abnormal uterine bleeding (AUB)   . Adnexal mass    right  . Anemia    history of  . Breast lesion 1999   history of transient breast lesion  . History of ectopic pregnancy    2000  . HSV-2 (herpes simplex virus 2) infection   . Hypertension    history of, not currently needing medications  . Menorrhagia   . PONV (postoperative nausea and vomiting) 1999  . Uterine fibroid     Past Surgical History:  Procedure Laterality Date  . BACK SURGERY  1999  . BREAST SURGERY     benign lumpectomy  . LASIK    . TRANSTHORACIC ECHOCARDIOGRAM  05/24/2014   ef 55-60%/  mild AR/  trivial MR    Family History  Problem Relation Age of Onset  . Stroke Father   . Hypertension Father   . Cancer Father     prostate  . Anemia Sister     Social History:  reports that she has never smoked. She has never used smokeless tobacco. She reports that she does not drink alcohol or use drugs.  Allergies: No Known Allergies  No prescriptions prior to admission.    Review of Systems  Constitutional: Negative.   HENT: Negative.   Respiratory: Negative.   Cardiovascular: Negative.   Gastrointestinal: Negative.   Genitourinary: Negative.   Musculoskeletal: Negative.   Skin: Negative.   Neurological: Negative.    Endo/Heme/Allergies: Negative.   Psychiatric/Behavioral: Negative.     Last menstrual period 02/03/2017. Physical Exam  Constitutional: She is oriented to person, place, and time. She appears well-developed and well-nourished.  HENT:  Head: Atraumatic.  Eyes: EOM are normal.  Neck: Normal range of motion. Neck supple.  Cardiovascular: Normal rate and regular rhythm.   Respiratory: Effort normal and breath sounds normal.  GI: Soft. Bowel sounds are normal.  Genitourinary:  Genitourinary Comments: Pelvic exam: ,  VULVA: normal appearing vulva with no masses, tenderness or lesions,  VAGINA: normal appearing vagina with normal color and discharge, no lesions,  CERVIX: normal appearing cervix without discharge or lesions,  UTERUS: enlarged to 10 week's size,  ADNEXA: normal adnexa in size, nontender and no masses.  Musculoskeletal: Normal range of motion.  Neurological: She is alert and oriented to person, place, and time.  Skin: Skin is warm and dry.  Psychiatric: She has a normal mood and affect.   Recent Results (from the past 2160 hour(s))  CBC     Status: Abnormal   Collection Time: 02/10/17  2:00 PM  Result Value Ref Range   WBC 6.0 4.0 - 10.5 K/uL   RBC 3.53 (L) 3.87 - 5.11 MIL/uL   Hemoglobin 8.4 (L) 12.0 - 15.0 g/dL   HCT 27.5 (L) 36.0 - 46.0 %   MCV 77.9 (L) 78.0 - 100.0 fL   MCH 23.8 (L) 26.0 - 34.0 pg   MCHC 30.5 30.0 - 36.0 g/dL  RDW 18.1 (H) 11.5 - 15.5 %   Platelets 381 150 - 400 K/uL  Basic metabolic panel     Status: Abnormal   Collection Time: 02/10/17  2:00 PM  Result Value Ref Range   Sodium 136 135 - 145 mmol/L   Potassium 3.7 3.5 - 5.1 mmol/L   Chloride 107 101 - 111 mmol/L   CO2 23 22 - 32 mmol/L   Glucose, Bld 102 (H) 65 - 99 mg/dL   BUN 10 6 - 20 mg/dL   Creatinine, Ser 0.66 0.44 - 1.00 mg/dL   Calcium 8.6 (L) 8.9 - 10.3 mg/dL   GFR calc non Af Amer >60 >60 mL/min   GFR calc Af Amer >60 >60 mL/min    Comment: (NOTE) The eGFR has been  calculated using the CKD EPI equation. This calculation has not been validated in all clinical situations. eGFR's persistently <60 mL/min signify possible Chronic Kidney Disease.    Anion gap 6 5 - 15    No results found for this or any previous visit (from the past 24 hour(s)).  No results found.  Assessment/Plan: Menorrhagia Anemia Fibroids Cystic Adnexal mass Hx ectopic  Recommendation: Endometrial ablation L/s bilateral salpingectomy Indications and risks reviewed.  The risks of anesthesia, bleeding, infection and damage to adjacent organs reviewed.  Risk of uterine perforatin reviewed and pt understands that if this should occur, the procedure will need to be discontinued without completion of ablation.  Brok Stocking P 02/18/2017, 9:48 PM

## 2017-02-18 NOTE — Anesthesia Preprocedure Evaluation (Addendum)
Anesthesia Evaluation  Patient identified by MRN, date of birth, ID band Patient awake    Reviewed: Allergy & Precautions, H&P , NPO status , Patient's Chart, lab work & pertinent test results  History of Anesthesia Complications (+) PONV  Airway Mallampati: II  TM Distance: >3 FB Neck ROM: Full    Dental no notable dental hx. (+) Teeth Intact, Dental Advisory Given,    Pulmonary neg pulmonary ROS,    Pulmonary exam normal breath sounds clear to auscultation       Cardiovascular Exercise Tolerance: Good hypertension,  Rhythm:Regular Rate:Normal     Neuro/Psych negative neurological ROS  negative psych ROS   GI/Hepatic negative GI ROS, Neg liver ROS,   Endo/Other  negative endocrine ROS  Renal/GU negative Renal ROS  negative genitourinary   Musculoskeletal   Abdominal   Peds  Hematology negative hematology ROS (+) anemia ,   Anesthesia Other Findings   Reproductive/Obstetrics negative OB ROS                           Anesthesia Physical Anesthesia Plan  ASA: II  Anesthesia Plan: General   Post-op Pain Management:    Induction: Intravenous  Airway Management Planned: Oral ETT  Additional Equipment:   Intra-op Plan:   Post-operative Plan: Extubation in OR  Informed Consent: I have reviewed the patients History and Physical, chart, labs and discussed the procedure including the risks, benefits and alternatives for the proposed anesthesia with the patient or authorized representative who has indicated his/her understanding and acceptance.   Dental advisory given  Plan Discussed with: CRNA  Anesthesia Plan Comments:         Anesthesia Quick Evaluation

## 2017-02-19 ENCOUNTER — Encounter (HOSPITAL_BASED_OUTPATIENT_CLINIC_OR_DEPARTMENT_OTHER): Payer: Self-pay

## 2017-02-19 ENCOUNTER — Ambulatory Visit (HOSPITAL_BASED_OUTPATIENT_CLINIC_OR_DEPARTMENT_OTHER): Payer: Federal, State, Local not specified - PPO | Admitting: Anesthesiology

## 2017-02-19 ENCOUNTER — Encounter (HOSPITAL_BASED_OUTPATIENT_CLINIC_OR_DEPARTMENT_OTHER)
Admission: RE | Disposition: A | Discharge status: Home / Self Care | Payer: Self-pay | Source: Ambulatory Visit | Attending: Obstetrics and Gynecology

## 2017-02-19 ENCOUNTER — Ambulatory Visit (HOSPITAL_BASED_OUTPATIENT_CLINIC_OR_DEPARTMENT_OTHER)
Admission: RE | Admit: 2017-02-19 | Discharge: 2017-02-19 | Disposition: A | Discharge status: Home / Self Care | Payer: Federal, State, Local not specified - PPO | Source: Ambulatory Visit | Attending: Obstetrics and Gynecology | Admitting: Obstetrics and Gynecology

## 2017-02-19 DIAGNOSIS — Z302 Encounter for sterilization: Secondary | ICD-10-CM | POA: Diagnosis not present

## 2017-02-19 DIAGNOSIS — D649 Anemia, unspecified: Secondary | ICD-10-CM | POA: Insufficient documentation

## 2017-02-19 DIAGNOSIS — N7011 Chronic salpingitis: Secondary | ICD-10-CM | POA: Insufficient documentation

## 2017-02-19 DIAGNOSIS — D219 Benign neoplasm of connective and other soft tissue, unspecified: Secondary | ICD-10-CM | POA: Diagnosis present

## 2017-02-19 DIAGNOSIS — N920 Excessive and frequent menstruation with regular cycle: Secondary | ICD-10-CM | POA: Diagnosis not present

## 2017-02-19 DIAGNOSIS — I10 Essential (primary) hypertension: Secondary | ICD-10-CM | POA: Diagnosis not present

## 2017-02-19 DIAGNOSIS — N736 Female pelvic peritoneal adhesions (postinfective): Secondary | ICD-10-CM | POA: Insufficient documentation

## 2017-02-19 DIAGNOSIS — N840 Polyp of corpus uteri: Secondary | ICD-10-CM | POA: Insufficient documentation

## 2017-02-19 DIAGNOSIS — N939 Abnormal uterine and vaginal bleeding, unspecified: Secondary | ICD-10-CM | POA: Diagnosis not present

## 2017-02-19 DIAGNOSIS — D251 Intramural leiomyoma of uterus: Secondary | ICD-10-CM

## 2017-02-19 DIAGNOSIS — Z79899 Other long term (current) drug therapy: Secondary | ICD-10-CM | POA: Diagnosis not present

## 2017-02-19 DIAGNOSIS — D500 Iron deficiency anemia secondary to blood loss (chronic): Secondary | ICD-10-CM

## 2017-02-19 DIAGNOSIS — D252 Subserosal leiomyoma of uterus: Secondary | ICD-10-CM

## 2017-02-19 HISTORY — DX: Anemia, unspecified: D64.9

## 2017-02-19 HISTORY — PX: LAPAROSCOPIC BILATERAL SALPINGECTOMY: SHX5889

## 2017-02-19 HISTORY — DX: Personal history of other complications of pregnancy, childbirth and the puerperium: Z87.59

## 2017-02-19 HISTORY — PX: HYSTEROSCOPY: SHX211

## 2017-02-19 HISTORY — DX: Excessive and frequent menstruation with regular cycle: N92.0

## 2017-02-19 HISTORY — DX: Leiomyoma of uterus, unspecified: D25.9

## 2017-02-19 HISTORY — DX: Abnormal uterine and vaginal bleeding, unspecified: N93.9

## 2017-02-19 LAB — POCT PREGNANCY, URINE: Preg Test, Ur: NEGATIVE

## 2017-02-19 SURGERY — SALPINGECTOMY, BILATERAL, LAPAROSCOPIC
Anesthesia: General

## 2017-02-19 MED ORDER — PROPOFOL 10 MG/ML IV BOLUS
INTRAVENOUS | Status: DC | PRN
  Administered 2017-02-19: 120 mg via INTRAVENOUS

## 2017-02-19 MED ORDER — SCOPOLAMINE 1 MG/3DAYS TD PT72
MEDICATED_PATCH | TRANSDERMAL | Status: AC
  Filled 2017-02-19: qty 1

## 2017-02-19 MED ORDER — FENTANYL CITRATE (PF) 100 MCG/2ML IJ SOLN
INTRAMUSCULAR | Status: AC
  Filled 2017-02-19: qty 2

## 2017-02-19 MED ORDER — ROCURONIUM BROMIDE 50 MG/5ML IV SOSY
PREFILLED_SYRINGE | INTRAVENOUS | Status: AC
  Filled 2017-02-19: qty 10

## 2017-02-19 MED ORDER — OXYCODONE-ACETAMINOPHEN 5-325 MG PO TABS: ORAL_TABLET | ORAL | 0 refills | Status: AC

## 2017-02-19 MED ORDER — PROPOFOL 500 MG/50ML IV EMUL
INTRAVENOUS | Status: AC
  Filled 2017-02-19: qty 100

## 2017-02-19 MED ORDER — FENTANYL CITRATE (PF) 250 MCG/5ML IJ SOLN
INTRAMUSCULAR | Status: AC
  Filled 2017-02-19: qty 5

## 2017-02-19 MED ORDER — CEFAZOLIN SODIUM 1 G IJ SOLR
INTRAMUSCULAR | Status: AC
  Filled 2017-02-19: qty 20

## 2017-02-19 MED ORDER — LIDOCAINE HCL 1 % IJ SOLN
INTRAMUSCULAR | Status: DC | PRN
  Administered 2017-02-19: 10 mL

## 2017-02-19 MED ORDER — ROCURONIUM BROMIDE 100 MG/10ML IV SOLN
INTRAVENOUS | Status: DC | PRN
  Administered 2017-02-19: 50 mg via INTRAVENOUS
  Administered 2017-02-19: 10 mg via INTRAVENOUS

## 2017-02-19 MED ORDER — SUGAMMADEX SODIUM 200 MG/2ML IV SOLN
INTRAVENOUS | Status: DC | PRN
  Administered 2017-02-19: 200 mg via INTRAVENOUS

## 2017-02-19 MED ORDER — DEXAMETHASONE SODIUM PHOSPHATE 4 MG/ML IJ SOLN
INTRAMUSCULAR | Status: DC | PRN
  Administered 2017-02-19: 10 mg via INTRAVENOUS

## 2017-02-19 MED ORDER — PHENYLEPHRINE HCL 10 MG/ML IJ SOLN
INTRAMUSCULAR | Status: DC | PRN
  Administered 2017-02-19 (×2): 80 ug via INTRAVENOUS

## 2017-02-19 MED ORDER — LACTATED RINGERS IV SOLN
INTRAVENOUS | Status: DC
  Administered 2017-02-19 (×3): via INTRAVENOUS
  Filled 2017-02-19: qty 1000

## 2017-02-19 MED ORDER — FENTANYL CITRATE (PF) 100 MCG/2ML IJ SOLN
INTRAMUSCULAR | Status: DC | PRN
  Administered 2017-02-19: 100 ug via INTRAVENOUS
  Administered 2017-02-19: 50 ug via INTRAVENOUS
  Administered 2017-02-19: 100 ug via INTRAVENOUS
  Administered 2017-02-19 (×2): 50 ug via INTRAVENOUS

## 2017-02-19 MED ORDER — SCOPOLAMINE 1 MG/3DAYS TD PT72
1.0000 | MEDICATED_PATCH | TRANSDERMAL | Status: DC
  Administered 2017-02-19: 1 via TRANSDERMAL
  Administered 2017-02-19: 1.5 mg via TRANSDERMAL
  Filled 2017-02-19: qty 1

## 2017-02-19 MED ORDER — BUPIVACAINE HCL (PF) 0.25 % IJ SOLN
INTRAMUSCULAR | Status: DC | PRN
  Administered 2017-02-19: 10 mL

## 2017-02-19 MED ORDER — LIDOCAINE 2% (20 MG/ML) 5 ML SYRINGE
INTRAMUSCULAR | Status: AC
  Filled 2017-02-19: qty 5

## 2017-02-19 MED ORDER — IBUPROFEN 600 MG PO TABS: ORAL_TABLET | ORAL | 1 refills | Status: AC

## 2017-02-19 MED ORDER — MENTHOL 3 MG MT LOZG
LOZENGE | OROMUCOSAL | Status: AC
  Filled 2017-02-19: qty 9

## 2017-02-19 MED ORDER — SUGAMMADEX SODIUM 200 MG/2ML IV SOLN
INTRAVENOUS | Status: AC
  Filled 2017-02-19: qty 2

## 2017-02-19 MED ORDER — KETOROLAC TROMETHAMINE 30 MG/ML IJ SOLN
INTRAMUSCULAR | Status: DC | PRN
  Administered 2017-02-19 (×2): 30 mg via INTRAVENOUS

## 2017-02-19 MED ORDER — MIDAZOLAM HCL 5 MG/5ML IJ SOLN
INTRAMUSCULAR | Status: DC | PRN
  Administered 2017-02-19: 2 mg via INTRAVENOUS

## 2017-02-19 MED ORDER — ONDANSETRON HCL 4 MG/2ML IJ SOLN
INTRAMUSCULAR | Status: DC | PRN
  Administered 2017-02-19: 4 mg via INTRAVENOUS

## 2017-02-19 MED ORDER — DIPHENHYDRAMINE HCL 50 MG/ML IJ SOLN
INTRAMUSCULAR | Status: DC | PRN
Start: 2017-02-19 — End: 2017-02-19
  Administered 2017-02-19: 12.5 mg via INTRAVENOUS

## 2017-02-19 MED ORDER — CEFAZOLIN SODIUM-DEXTROSE 2-3 GM-% IV SOLR
INTRAVENOUS | Status: DC | PRN
Start: 2017-02-19 — End: 2017-02-19
  Administered 2017-02-19: 2 g via INTRAVENOUS

## 2017-02-19 MED ORDER — MIDAZOLAM HCL 2 MG/2ML IJ SOLN
INTRAMUSCULAR | Status: AC
  Filled 2017-02-19: qty 2

## 2017-02-19 MED ORDER — WHITE PETROLATUM GEL
Status: AC
  Filled 2017-02-19: qty 20

## 2017-02-19 MED ORDER — LIDOCAINE HCL (CARDIAC) 20 MG/ML IV SOLN
INTRAVENOUS | Status: DC | PRN
  Administered 2017-02-19: 60 mg via INTRAVENOUS

## 2017-02-19 MED ORDER — DIPHENHYDRAMINE HCL 50 MG/ML IJ SOLN
INTRAMUSCULAR | Status: AC
  Filled 2017-02-19: qty 1

## 2017-02-19 MED ORDER — HYDROMORPHONE HCL 1 MG/ML IJ SOLN
0.2500 mg | INTRAMUSCULAR | Status: DC | PRN
  Filled 2017-02-19: qty 0.5

## 2017-02-19 MED ORDER — PHENYLEPHRINE 40 MCG/ML (10ML) SYRINGE FOR IV PUSH (FOR BLOOD PRESSURE SUPPORT)
PREFILLED_SYRINGE | INTRAVENOUS | Status: AC
  Filled 2017-02-19: qty 10

## 2017-02-19 MED FILL — OXYCODONE/APAP 5/325 MG TAB: 5-325 | 2 days supply | Qty: 20 | Fill #0

## 2017-02-19 MED FILL — IBUPROFEN 600 MG TABLET: 600 | 7 days supply | Qty: 30 | Fill #0

## 2017-02-19 SURGICAL SUPPLY — 65 items
BAG URINE DRAINAGE (UROLOGICAL SUPPLIES) IMPLANT
BALLN HASSAN TROCAR (MISCELLANEOUS) IMPLANT
BIPOLAR CUTTING LOOP 21FR (ELECTRODE)
BLADE SURG 11 STRL SS (BLADE) ×3 IMPLANT
BOOTIES KNEE HIGH SLOAN (MISCELLANEOUS) ×3 IMPLANT
CABLE HIGH FREQUENCY MONO STRZ (ELECTRODE) IMPLANT
CANISTER SUCT 3000ML PPV (MISCELLANEOUS) ×3 IMPLANT
CATH ROBINSON RED A/P 16FR (CATHETERS) ×3 IMPLANT
CATH SILICONE 16FRX5CC (CATHETERS) IMPLANT
CHLORAPREP W/TINT 26ML (MISCELLANEOUS) ×3 IMPLANT
COVER BACK TABLE 60X90IN (DRAPES) ×3 IMPLANT
DERMABOND ADVANCED (GAUZE/BANDAGES/DRESSINGS) ×1
DERMABOND ADVANCED .7 DNX12 (GAUZE/BANDAGES/DRESSINGS) ×2 IMPLANT
DILATOR CANAL MILEX (MISCELLANEOUS) IMPLANT
DRAPE LG THREE QUARTER DISP (DRAPES) ×3 IMPLANT
DRAPE UNDERBUTTOCKS STRL (DRAPE) ×3 IMPLANT
DRSG OPSITE POSTOP 3X4 (GAUZE/BANDAGES/DRESSINGS) ×3 IMPLANT
DRSG TELFA 3X8 NADH (GAUZE/BANDAGES/DRESSINGS) ×3 IMPLANT
ELECT REM PT RETURN 9FT ADLT (ELECTROSURGICAL) ×3
ELECTRODE REM PT RTRN 9FT ADLT (ELECTROSURGICAL) ×2 IMPLANT
FILTER ARTHROSCOPY CONVERTOR (FILTER) ×3 IMPLANT
GLOVE SURG SS PI 6.5 STRL IVOR (GLOVE) ×9 IMPLANT
GOWN STRL REUS W/TWL LRG LVL3 (GOWN DISPOSABLE) ×6 IMPLANT
IV NS IRRIG 3000ML ARTHROMATIC (IV SOLUTION) ×9 IMPLANT
KIT RM TURNOVER CYSTO AR (KITS) ×3 IMPLANT
LEGGING LITHOTOMY PAIR STRL (DRAPES) ×3 IMPLANT
LOOP CUTTING BIPOLAR 21FR (ELECTRODE) IMPLANT
NS IRRIG 500ML POUR BTL (IV SOLUTION) ×3 IMPLANT
PACK BASIN DAY SURGERY FS (CUSTOM PROCEDURE TRAY) ×3 IMPLANT
PACK LAPAROSCOPY II (CUSTOM PROCEDURE TRAY) ×3 IMPLANT
PAD ION LEFT ARM DISP (MISCELLANEOUS) ×3 IMPLANT
PAD ION RIGHT ARM DISP (MISCELLANEOUS) ×3 IMPLANT
PAD OB MATERNITY 4.3X12.25 (PERSONAL CARE ITEMS) ×3 IMPLANT
PAD POSITIONING PINK XL (MISCELLANEOUS) ×3 IMPLANT
POUCH SPECIMEN RETRIEVAL 10MM (ENDOMECHANICALS) ×3 IMPLANT
SCALPEL HARMONIC ACE (MISCELLANEOUS) IMPLANT
SET GENESYS HTA PROCERVA (MISCELLANEOUS) ×3 IMPLANT
SET IRRIG TUBING LAPAROSCOPIC (IRRIGATION / IRRIGATOR) IMPLANT
STRIP CLOSURE SKIN 1/4X3 (GAUZE/BANDAGES/DRESSINGS) IMPLANT
SUT MNCRL AB 3-0 PS2 18 (SUTURE) ×3 IMPLANT
SUT VIC AB 0 CT2 27 (SUTURE) IMPLANT
SUT VIC AB 3-0 PS2 18 (SUTURE)
SUT VIC AB 3-0 PS2 18XBRD (SUTURE) IMPLANT
SUT VICRYL 0 27 CT2 27 ABS (SUTURE) ×3 IMPLANT
SUT VICRYL 0 ENDOLOOP (SUTURE) IMPLANT
SUT VICRYL 0 UR6 27IN ABS (SUTURE) ×9 IMPLANT
SYR 50ML LL SCALE MARK (SYRINGE) ×3 IMPLANT
SYR CONTROL 10ML LL (SYRINGE) ×3 IMPLANT
SYRINGE 10CC LL (SYRINGE) ×6 IMPLANT
TOWEL OR 17X24 6PK STRL BLUE (TOWEL DISPOSABLE) ×6 IMPLANT
TRAY DSU PREP LF (CUSTOM PROCEDURE TRAY) ×3 IMPLANT
TRAY FOLEY BAG SILVER LF 14FR (CATHETERS) ×3 IMPLANT
TRAY FOLEY CATH SILVER 14FR (SET/KITS/TRAYS/PACK) ×3 IMPLANT
TROCAR BALL TOP DISP 5MM (ENDOMECHANICALS) IMPLANT
TROCAR BALLN 12MMX100 BLUNT (TROCAR) IMPLANT
TROCAR OPTI TIP 5M 100M (ENDOMECHANICALS) ×3 IMPLANT
TROCAR XCEL BLUNT TIP 100MML (ENDOMECHANICALS) IMPLANT
TROCAR XCEL DIL TIP R 11M (ENDOMECHANICALS) ×3 IMPLANT
TROCAR XCEL OPT SLVE 5M 100M (ENDOMECHANICALS) ×3 IMPLANT
TROCAR Z-THREAD BLADED 11X100M (TROCAR) IMPLANT
TUBE CONNECTING 12X1/4 (SUCTIONS) IMPLANT
TUBING AQUILEX INFLOW (TUBING) ×3 IMPLANT
TUBING AQUILEX OUTFLOW (TUBING) ×3 IMPLANT
WARMER LAPAROSCOPE (MISCELLANEOUS) IMPLANT
WATER STERILE IRR 500ML POUR (IV SOLUTION) ×3 IMPLANT

## 2017-02-19 NOTE — Anesthesia Postprocedure Evaluation (Signed)
Anesthesia Post Note  Patient: Dietitian  Procedure(s) Performed: Procedure(s) (LRB): LAPAROSCOPIC BILATERAL SALPINGECTOMY (Bilateral) HYSTEROSCOPY WITH HYDROTHERMAL ABLATION (N/A)  Patient location during evaluation: PACU Anesthesia Type: General Level of consciousness: awake and alert Pain management: pain level controlled Vital Signs Assessment: post-procedure vital signs reviewed and stable Respiratory status: spontaneous breathing, nonlabored ventilation, respiratory function stable and patient connected to nasal cannula oxygen Cardiovascular status: blood pressure returned to baseline and stable Postop Assessment: no signs of nausea or vomiting Anesthetic complications: no       Last Vitals:  Vitals:   02/19/17 1300 02/19/17 1315  BP: (!) 144/91 128/89  Pulse: 81 67  Resp: 12 13  Temp:      Last Pain:  Vitals:   02/19/17 1330  TempSrc:   PainSc: 2                  Tiajuana Amass

## 2017-02-19 NOTE — Discharge Instructions (Signed)
Call [facility] @ [phone] if:  You have a temperature greater than or equal to 100.4 degrees Farenheit orally You have pain that is not made better by the pain medication given and taken as directed You have excessive bleeding or problems urinating  Take Colace (Docusate Sodium/Stool Softener) 100 mg 2-3 times daily while taking narcotic pain medicine to avoid constipation or until bowel movements are regular. Take Ibuprofen 600 mg with food every 6 hours for 5 days then as needed for pain   You may drive after 36 hours You may walk up steps  You may shower tomorrow You may resume a regular diet  Keep incisions clean and dry; remove honeycomb dressing on 02/26/2017  Avoid anything in vagina  until after your post-operative visit.  Post Anesthesia Home Care Instructions  Activity: Get plenty of rest for the remainder of the day. A responsible individual must stay with you for 24 hours following the procedure.  For the next 24 hours, DO NOT: -Drive a car -Paediatric nurse -Drink alcoholic beverages -Take any medication unless instructed by your physician -Make any legal decisions or sign important papers.  Meals: Start with liquid foods such as gelatin or soup. Progress to regular foods as tolerated. Avoid greasy, spicy, heavy foods. If nausea and/or vomiting occur, drink only clear liquids until the nausea and/or vomiting subsides. Call your physician if vomiting continues.  Special Instructions/Symptoms: Your throat may feel dry or sore from the anesthesia or the breathing tube placed in your throat during surgery. If this causes discomfort, gargle with warm salt water. The discomfort should disappear within 24 hours.  If you had a scopolamine patch placed behind your ear for the management of post- operative nausea and/or vomiting:  1. The medication in the patch is effective for 72 hours, after which it should be removed.  Wrap patch in a tissue and discard  in the trash. Wash hands thoroughly with soap and water. 2. You may remove the patch earlier than 72 hours if you experience unpleasant side effects which may include dry mouth, dizziness or visual disturbances. 3. Avoid touching the patch. Wash your hands with soap and water after contact with the patch.   HOME CARE INSTRUCTIONS - LAPAROSCOPY  Wound Care: The bandaids or dressing which are placed over the skin openings may be removed the day after surgery. The incision should be kept clean and dry. The stitches do not need to be removed. Should the incision become sore, red, and swollen after the first week, check with your doctor.  Personal Hygiene: Shower the day after your procedure. Always wipe from front to back after elimination.   Activity: Do not drive or operate any equipment today. The effects of the anesthesia are still present and drowsiness may result. Rest today, not necessarily flat bed rest, just take it easy. You may resume your normal activity in one to three days or as instructed by your physician.  Sexual Activity: You resume sexual activity as indicated by your physician_________. If your laparoscopy was for a sterilization ( tubes tied ), continue current method of birth control until after your next period or ask for specific instructions from your doctor.  Diet: Eat a light diet as desired this evening. You may resume a regular diet tomorrow.  Return to Work: Two to three days or as indicated by your doctor.  Expectations After Surgery: Your surgery will cause vaginal drainage or spotting which may continue for 2-3 days. Mild abdominal discomfort or  tenderness is not unusual and some shoulder pain may also be noted which can be relieved by lying flat in pain.  Call Your Doctor If these Occur:  Persistent or heavy bleeding at incision site       Redness or swelling around incision       Elevation of temperature greater than 100 degrees F  Call for follow-up  appointment _____________.

## 2017-02-19 NOTE — Brief Op Note (Signed)
02/19/2017  12:45 PM  PATIENT:  Katie Kemp  46 y.o. female  PRE-OPERATIVE DIAGNOSIS:  Menorrhagia, Abnormal Uterine Bleeding, Desire for surgical sterilization  POST-OPERATIVE DIAGNOSIS:  Same plus extensive pelvic adhesions  PROCEDURE:  Procedure(s): LAPAROSCOPIC BILATERAL SALPINGECTOMY (Bilateral) HYSTEROSCOPY WITH HYDROTHERMAL ABLATION (N/A)  SURGEON:  Surgeon(s) and Role:    * Eldred Manges, MD - Primary  PHYSICIAN ASSISTANT: elmira powell PA-C   ANESTHESIA:   general  EBL:  Total I/O In: 2300 [I.V.:2300] Out: 400 [Urine:350; Blood:50]  BLOOD ADMINISTERED:none  DRAINS: none   LOCAL MEDICATIONS USED:  MARCAINE   , LIDOCAINE  and Amount: 10 ml  SPECIMEN:  Source of Specimen:  bilateral fallopian tubes and endometrial currettings  DISPOSITION OF SPECIMEN:  PATHOLOGY  COUNTS:  YES   DICTATION: .Dragon Dictation  PLAN OF CARE: Discharge to home after PACU  PATIENT DISPOSITION:  PACU - hemodynamically stable.   Delay start of Pharmacological VTE agent (>24hrs) due to surgical blood loss or risk of bleeding: yes

## 2017-02-19 NOTE — Op Note (Signed)
02/19/2017  12:39 PM  PATIENT:  Katie Kemp  46 y.o. female  PRE-OPERATIVE DIAGNOSIS:  Menorrhagia, Abnormal Uterine Bleeding, Hydrosalpinx,  Desire for surgical sterilization  POST-OPERATIVE DIAGNOSIS: Same plus extensive pelvic adhesions.  PROCEDURE:  Procedure(s): LAPAROSCOPIC BILATERAL SALPINGECTOMY, EXTENSIVE LYSIS OF ADHESIONS,  HYSTEROSCOPY WITH HYDROTHERMAL ABLATION  SURGEON:  Perle Gibbon P, MD  ASSISTANTS: ELMIRA POWELL,  PA-C  ANESTHESIA:   general  ESTIMATED BLOOD LOSS: 50CC  BLOOD ADMINISTERED:none  COMPLICATIONS:NONE  FINDINGS: At hysteroscopy the uterus sounded to 7 cm. The endometrial cavity was clean and free of any lesions. The tubal ostia were visible bilaterally. At laparoscopy, the pelvis was filled with filmy adhesions between the omentum and the anterior abdominal wall, the omentum and the pelvic sidewalls, the omentum and the anterior uterine wall. The right fallopian tube was essentially normal in caliber but was adherent at its fimbriated in to the ovary and to the appendix. The actual appendix appeared normal. The left fallopian tube was markedly enlarged to a diameter of approximately 6 cm and was approximately 8 cm in length as a true hydrosalpinx. It was adherent to the surrounding omentum as well as to the anterior uterus.  FLUID DEFICIT:45CC  INTRAOPERATIVE MEDICATIONS:  Ancef and Toradol  LOCAL MEDICATIONS USED:  LIDOCAINE  and Amount: 10 ml  SPECIMEN:  Source of Specimen:  #1)endometrial curettings  #2) left fallopian tube with hydrosalpinx  #3) right fallopian tube with stitch in serosa  DISPOSITION OF SPECIMEN:  PATHOLOGY  COUNTS:  YES  DESCRIPTION OF PROCEDURE:the patient was taken to the operating room after appropriate identification and placed on the operating table. After the attainment of adequate general anesthesia she was placed in the lithotomy position. The perineum and vagina were prepped with multiple layers of Betadine.   The abdomen was prepped with ChloraPrep. A Foley catheter was placed in the bladder and connected to straight drainage. The perineum was draped in sterile field. The the abdomen was likewise draped in a sterile field and covered with a second sterile barrier.  A gray speculum was placed in the vagina. The cervix was grasped with a single-tooth tenaculum. A paracervical block was achieved with a total of 10 cc of 2% Xylocaine and the 5 and 7:00 positions. The uterus was sounded.  The cervix was then dilated to accommodate the diagnostic hysteroscope. The hysteroscope was used to evaluate all quadrants of the uterus.  An endometrial curettings sample was obtained. The HTA apparatus was then placed into the endometrial cavity beyond the internal os. After ensuring integrity of the uterus, the HTA ablation cycle was begun by first heating the fluid to 90 and then going through a 10 minute therapeutic cycle. Cooling cycle was then undertaken and the HTA apparatus removed from the operative field. An acorn uterine manipulator was then placed in the cervix and the surgeon changed gown and gloves in preparation for the laparoscopy. Subumbilical and suprapubic injections of quarter percent Marcaine for total of 10 cc was undertaken. A subumbilical incision was made and a combination of blunt and sharp dissection took that incision down to the level of the fascia. The fascia was incised and the peritoneum entered bluntly. The peritoneum and fascia were tagged with a suture of 0 Vicryl and a Hassan cannula placed into the peritoneal cavity under direct visualization. The Hassan cannula was secured with the holding sutures. A pneumoperitoneum was created. Suprapubic incisions were made to the right and left of midline and laparoscopic probe trochars of 5 mm were placed through  those incisions under direct visualization. The above-noted significant adhesions were noted and lysis of adhesions begun. Lysis of adhesions was  continued sharply and with monopolar cautery to allow visualization of the large left hydrosalpinx. Lysis of adhesions around the hydrosalpinx was undertaken until it was freed at its fimbriated in. Bipolar cautery was then used along the mesial salpinx to cauterized. Then cut. The mesial salpinx medially to the level of the uterotubal junction. Junction was likewise bipolar cauterized and cut with freeing of the large hydrosalpinx. Most of fluid from the hydrosalpinx had drained during its dissection and the left hydrosalpinx was able to be removed through the subumbilical port. Lysis of adhesions was continued toward the right. Allowing the appendix to be freed from the right fallopian tube as well as the fallopian tube to be freed from its ovarian adhesions. At that point. I told her cautery was used along the mesial salpinx to cauterized. Then cut medially to the level of the uterotubal junction on the right. Bipolar cautery was utilized and the tube excised at its uterotubal junction. Right fallopian tube was then removed through the subumbilical port. Copious irrigation was carried out and hemostasis noted to be adequate. All instruments were removed from the peritoneal cavity under direct visualization as the CO2 was allowed to escape. The subumbilical incision was closed with the held 0 Vicryl sutures in figure-of-eight fashion. A subcutaneous suture of 0 Vicryl was placed for adequate hemostasis. The skin incision was closed with a subcuticular suture of 3-0 Monocryl. The 2 suprapubic incisions were then closed with Dermabond. A sterile dressing was applied to the subumbilical incision. All instruments were then removed from the vagina and the patient was awakened from general anesthesia and taken to the recovery room in satisfactory condition having tolerated the procedure well sponge and instrument counts correct.  PLAN OF CARE: Discharge home after postanesthesia care  PATIENT DISPOSITION:  PACU -  hemodynamically stable.   Delay start of Pharmacological VTE agent (>24hrs) due to surgical blood loss or risk of bleeding:  Yes.  SCD hose were used during the procedure.   Eldred Manges, MD 12:39 PM

## 2017-02-19 NOTE — Anesthesia Procedure Notes (Signed)
Procedure Name: Intubation Date/Time: 02/19/2017 8:47 AM Performed by: Wanita Chamberlain Pre-anesthesia Checklist: Patient identified, Timeout performed, Emergency Drugs available, Suction available and Patient being monitored Patient Re-evaluated:Patient Re-evaluated prior to inductionOxygen Delivery Method: Circle system utilized Preoxygenation: Pre-oxygenation with 100% oxygen Intubation Type: IV induction Ventilation: Mask ventilation without difficulty Laryngoscope Size: Mac and 3 Grade View: Grade I Tube type: Oral Tube size: 7.0 mm Number of attempts: 1 Airway Equipment and Method: Stylet Placement Confirmation: ETT inserted through vocal cords under direct vision,  positive ETCO2 and breath sounds checked- equal and bilateral Secured at: 22 cm Tube secured with: Tape Dental Injury: Teeth and Oropharynx as per pre-operative assessment

## 2017-02-19 NOTE — Transfer of Care (Signed)
Immediate Anesthesia Transfer of Care Note  Patient: Katie Kemp  Procedure(s) Performed: Procedure(s): LAPAROSCOPIC BILATERAL SALPINGECTOMY (Bilateral) HYSTEROSCOPY WITH HYDROTHERMAL ABLATION (N/A)  Patient Location: PACU  Anesthesia Type:General  Level of Consciousness: awake, alert , oriented and patient cooperative  Airway & Oxygen Therapy: Patient Spontanous Breathing and Patient connected to face mask oxygen  Post-op Assessment: Report given to RN and Post -op Vital signs reviewed and stable  Post vital signs: Reviewed and stable  Last Vitals:  Vitals:   02/19/17 0707  BP: (!) 159/92  Pulse: (!) 101  Resp: 20  Temp: 37.1 C    Last Pain:  Vitals:   02/19/17 0707  TempSrc: Oral      Patients Stated Pain Goal: 3 (92/76/[id number])  Complications: No apparent anesthesia complications

## 2017-02-20 ENCOUNTER — Encounter (HOSPITAL_BASED_OUTPATIENT_CLINIC_OR_DEPARTMENT_OTHER): Payer: Self-pay | Admitting: Obstetrics and Gynecology

## 2017-03-06 DIAGNOSIS — D649 Anemia, unspecified: Secondary | ICD-10-CM | POA: Diagnosis not present

## 2017-06-06 DIAGNOSIS — I10 Essential (primary) hypertension: Secondary | ICD-10-CM | POA: Diagnosis not present

## 2017-06-06 DIAGNOSIS — F064 Anxiety disorder due to known physiological condition: Secondary | ICD-10-CM | POA: Diagnosis not present

## 2017-06-16 DIAGNOSIS — N920 Excessive and frequent menstruation with regular cycle: Secondary | ICD-10-CM | POA: Diagnosis not present

## 2017-06-16 DIAGNOSIS — Z1231 Encounter for screening mammogram for malignant neoplasm of breast: Secondary | ICD-10-CM | POA: Diagnosis not present

## 2017-06-16 DIAGNOSIS — D259 Leiomyoma of uterus, unspecified: Secondary | ICD-10-CM | POA: Diagnosis not present

## 2018-06-24 DIAGNOSIS — I10 Essential (primary) hypertension: Secondary | ICD-10-CM | POA: Diagnosis not present

## 2018-06-24 DIAGNOSIS — D259 Leiomyoma of uterus, unspecified: Secondary | ICD-10-CM | POA: Diagnosis not present

## 2018-06-24 DIAGNOSIS — F064 Anxiety disorder due to known physiological condition: Secondary | ICD-10-CM | POA: Diagnosis not present

## 2018-06-24 DIAGNOSIS — Z1231 Encounter for screening mammogram for malignant neoplasm of breast: Secondary | ICD-10-CM | POA: Diagnosis not present

## 2018-06-24 DIAGNOSIS — G5603 Carpal tunnel syndrome, bilateral upper limbs: Secondary | ICD-10-CM | POA: Diagnosis not present

## 2018-06-24 DIAGNOSIS — Z01411 Encounter for gynecological examination (general) (routine) with abnormal findings: Secondary | ICD-10-CM | POA: Diagnosis not present

## 2018-06-24 DIAGNOSIS — Z6826 Body mass index (BMI) 26.0-26.9, adult: Secondary | ICD-10-CM | POA: Diagnosis not present

## 2018-06-24 DIAGNOSIS — D649 Anemia, unspecified: Secondary | ICD-10-CM | POA: Diagnosis not present

## 2018-07-28 DIAGNOSIS — Z1211 Encounter for screening for malignant neoplasm of colon: Secondary | ICD-10-CM | POA: Diagnosis not present

## 2018-07-28 DIAGNOSIS — Z01818 Encounter for other preprocedural examination: Secondary | ICD-10-CM | POA: Diagnosis not present

## 2018-08-17 DIAGNOSIS — K5730 Diverticulosis of large intestine without perforation or abscess without bleeding: Secondary | ICD-10-CM | POA: Diagnosis not present

## 2018-08-17 DIAGNOSIS — K5140 Inflammatory polyps of colon without complications: Secondary | ICD-10-CM | POA: Diagnosis not present

## 2018-08-17 DIAGNOSIS — D123 Benign neoplasm of transverse colon: Secondary | ICD-10-CM | POA: Diagnosis not present

## 2018-08-17 DIAGNOSIS — Z1211 Encounter for screening for malignant neoplasm of colon: Secondary | ICD-10-CM | POA: Diagnosis not present

## 2018-08-17 DIAGNOSIS — D122 Benign neoplasm of ascending colon: Secondary | ICD-10-CM | POA: Diagnosis not present

## 2018-08-19 DIAGNOSIS — K5140 Inflammatory polyps of colon without complications: Secondary | ICD-10-CM | POA: Diagnosis not present

## 2018-08-19 DIAGNOSIS — Z1211 Encounter for screening for malignant neoplasm of colon: Secondary | ICD-10-CM | POA: Diagnosis not present

## 2018-08-19 DIAGNOSIS — D122 Benign neoplasm of ascending colon: Secondary | ICD-10-CM | POA: Diagnosis not present

## 2018-09-02 DIAGNOSIS — I10 Essential (primary) hypertension: Secondary | ICD-10-CM | POA: Diagnosis not present

## 2018-09-02 DIAGNOSIS — F064 Anxiety disorder due to known physiological condition: Secondary | ICD-10-CM | POA: Diagnosis not present

## 2019-02-08 ENCOUNTER — Other Ambulatory Visit: Payer: Self-pay | Admitting: Family Medicine

## 2019-02-08 ENCOUNTER — Ambulatory Visit (INDEPENDENT_AMBULATORY_CARE_PROVIDER_SITE_OTHER): Payer: No Typology Code available for payment source

## 2019-02-08 DIAGNOSIS — R002 Palpitations: Secondary | ICD-10-CM

## 2020-12-26 ENCOUNTER — Ambulatory Visit: Payer: No Typology Code available for payment source | Attending: Internal Medicine

## 2020-12-26 DIAGNOSIS — Z23 Encounter for immunization: Secondary | ICD-10-CM

## 2020-12-26 NOTE — Progress Notes (Signed)
   Covid-19 Vaccination Clinic  Name:  Katie Kemp    MRN: [id number] DOB: 12/04/1970  12/26/2020  [name] was observed post Covid-19 immunization for 15 minutes without incident. She was provided with Vaccine Information Sheet and instruction to access the V-Safe system.   [name] was instructed to call 911 with any severe reactions post vaccine: Marland Kitchen Difficulty breathing  . Swelling of face and throat  . A fast heartbeat  . A bad rash all over body  . Dizziness and weakness   Immunizations Administered    Name Date Dose VIS Date Route   PFIZER Comrnaty(Gray TOP) Covid-19 Vaccine 12/26/2020  2:13 PM 0.3 mL 11/09/2020 Intramuscular   Manufacturer: [employer]   Lot: [id number]   NDC: [id number]

## 2022-07-04 ENCOUNTER — Other Ambulatory Visit (HOSPITAL_COMMUNITY): Payer: Self-pay

## 2022-07-04 MED ORDER — SEMAGLUTIDE-WEIGHT MANAGEMENT 0.25 MG/0.5ML ~~LOC~~ SOAJ
0.2500 mg | SUBCUTANEOUS | 0 refills | Status: AC
  Filled 2022-07-04 – 2022-07-31 (×3): qty 2, 28d supply, fill #0

## 2022-07-09 ENCOUNTER — Other Ambulatory Visit (HOSPITAL_COMMUNITY): Payer: Self-pay

## 2022-07-31 ENCOUNTER — Other Ambulatory Visit (HOSPITAL_COMMUNITY): Payer: Self-pay
